# Patient Record
Sex: Female | Born: 1980 | Race: White | Hispanic: No | Marital: Single | Smoking: Current every day smoker
Health system: Southern US, Community
[De-identification: ages and names within clinical notes are randomized; demographics above are authoritative.]

## PROBLEM LIST (undated history)

## (undated) DIAGNOSIS — E282 Polycystic ovarian syndrome: Secondary | ICD-10-CM

## (undated) DIAGNOSIS — E079 Disorder of thyroid, unspecified: Secondary | ICD-10-CM

## (undated) HISTORY — PX: KNEE SURGERY: SHX244

---

## 2015-02-25 ENCOUNTER — Emergency Department (HOSPITAL_BASED_OUTPATIENT_CLINIC_OR_DEPARTMENT_OTHER)
Admission: EM | Admit: 2015-02-25 | Discharge: 2015-02-25 | Disposition: A | Discharge status: Home / Self Care | Payer: Medicaid Other | Attending: Emergency Medicine | Admitting: Emergency Medicine

## 2015-02-25 ENCOUNTER — Emergency Department (HOSPITAL_BASED_OUTPATIENT_CLINIC_OR_DEPARTMENT_OTHER): Payer: Medicaid Other

## 2015-02-25 ENCOUNTER — Encounter (HOSPITAL_BASED_OUTPATIENT_CLINIC_OR_DEPARTMENT_OTHER): Payer: Self-pay

## 2015-02-25 DIAGNOSIS — Z8543 Personal history of malignant neoplasm of ovary: Secondary | ICD-10-CM | POA: Insufficient documentation

## 2015-02-25 DIAGNOSIS — F17200 Nicotine dependence, unspecified, uncomplicated: Secondary | ICD-10-CM | POA: Insufficient documentation

## 2015-02-25 DIAGNOSIS — M79606 Pain in leg, unspecified: Secondary | ICD-10-CM

## 2015-02-25 DIAGNOSIS — R110 Nausea: Secondary | ICD-10-CM | POA: Insufficient documentation

## 2015-02-25 DIAGNOSIS — M7989 Other specified soft tissue disorders: Secondary | ICD-10-CM | POA: Insufficient documentation

## 2015-02-25 DIAGNOSIS — M79604 Pain in right leg: Secondary | ICD-10-CM | POA: Diagnosis not present

## 2015-02-25 DIAGNOSIS — M791 Myalgia: Secondary | ICD-10-CM | POA: Diagnosis not present

## 2015-02-25 DIAGNOSIS — M25561 Pain in right knee: Secondary | ICD-10-CM

## 2015-02-25 DIAGNOSIS — E079 Disorder of thyroid, unspecified: Secondary | ICD-10-CM | POA: Diagnosis not present

## 2015-02-25 DIAGNOSIS — M2550 Pain in unspecified joint: Secondary | ICD-10-CM | POA: Insufficient documentation

## 2015-02-25 HISTORY — DX: Polycystic ovarian syndrome: E28.2

## 2015-02-25 HISTORY — DX: Disorder of thyroid, unspecified: E07.9

## 2015-02-25 MED ORDER — ACETAMINOPHEN 500 MG PO TABS
1000.0000 mg | ORAL_TABLET | Freq: Once | ORAL | Status: AC
  Administered 2015-02-25: 1000 mg via ORAL
  Filled 2015-02-25: qty 2

## 2015-02-25 MED ORDER — HYDROCODONE-ACETAMINOPHEN 5-325 MG PO TABS: 1.0000 | ORAL_TABLET | Freq: Four times a day (QID) | ORAL | Status: AC | PRN

## 2015-02-25 NOTE — ED Notes (Signed)
Patient transported to Ultrasound 

## 2015-02-25 NOTE — Discharge Instructions (Signed)
1. Medications: alternate naprosyn and tylenol for pain control, usual home medications °2. Treatment: rest, ice, elevate and use brace, drink plenty of fluids, gentle stretching °3. Follow Up: Please followup with orthopedics as directed or your PCP in 1 week if no improvement for discussion of your diagnoses and further evaluation after today's visit; if you do not have a primary care doctor use the resource guide provided to find one; Please return to the ER for worsening symptoms or other concerns ° ° ° °Cryotherapy °Cryotherapy means treatment with cold. Ice or gel packs can be used to reduce both pain and swelling. Ice is the most helpful within the first 24 to 48 hours after an injury or flare-up from overusing a muscle or joint. Sprains, strains, spasms, burning pain, shooting pain, and aches can all be eased with ice. Ice can also be used when recovering from surgery. Ice is effective, has very few side effects, and is safe for most people to use. °PRECAUTIONS  °Ice is not a safe treatment option for people with: °· Raynaud phenomenon. This is a condition affecting small blood vessels in the extremities. Exposure to cold may cause your problems to return. °· Cold hypersensitivity. There are many forms of cold hypersensitivity, including: °¨ Cold urticaria. Red, itchy hives appear on the skin when the tissues begin to warm after being iced. °¨ Cold erythema. This is a red, itchy rash caused by exposure to cold. °¨ Cold hemoglobinuria. Red blood cells break down when the tissues begin to warm after being iced. The hemoglobin that carry oxygen are passed into the urine because they cannot combine with blood proteins fast enough. °· Numbness or altered sensitivity in the area being iced. °If you have any of the following conditions, do not use ice until you have discussed cryotherapy with your caregiver: °· Heart conditions, such as arrhythmia, angina, or chronic heart disease. °· High blood  pressure. °· Healing wounds or open skin in the area being iced. °· Current infections. °· Rheumatoid arthritis. °· Poor circulation. °· Diabetes. °Ice slows the blood flow in the region it is applied. This is beneficial when trying to stop inflamed tissues from spreading irritating chemicals to surrounding tissues. However, if you expose your skin to cold temperatures for too long or without the proper protection, you can damage your skin or nerves. Watch for signs of skin damage due to cold. °HOME CARE INSTRUCTIONS °Follow these tips to use ice and cold packs safely. °· Place a dry or damp towel between the ice and skin. A damp towel will cool the skin more quickly, so you may need to shorten the time that the ice is used. °· For a more rapid response, add gentle compression to the ice. °· Ice for no more than 10 to 20 minutes at a time. The bonier the area you are icing, the less time it will take to get the benefits of ice. °· Check your skin after 5 minutes to make sure there are no signs of a poor response to cold or skin damage. °· Rest 20 minutes or more between uses. °· Once your skin is numb, you can end your treatment. You can test numbness by very lightly touching your skin. The touch should be so light that you do not see the skin dimple from the pressure of your fingertip. When using ice, most people will feel these normal sensations in this order: cold, burning, aching, and numbness. °· Do not use ice on someone who   cannot communicate their responses to pain, such as small children or people with dementia. °HOW TO MAKE AN ICE PACK °Ice packs are the most common way to use ice therapy. Other methods include ice massage, ice baths, and cryosprays. Muscle creams that cause a cold, tingly feeling do not offer the same benefits that ice offers and should not be used as a substitute unless recommended by your caregiver. °To make an ice pack, do one of the following: °· Place crushed ice or a bag of frozen  vegetables in a sealable plastic bag. Squeeze out the excess air. Place this bag inside another plastic bag. Slide the bag into a pillowcase or place a damp towel between your skin and the bag. °· Mix 3 parts water with 1 part rubbing alcohol. Freeze the mixture in a sealable plastic bag. When you remove the mixture from the freezer, it will be slushy. Squeeze out the excess air. Place this bag inside another plastic bag. Slide the bag into a pillowcase or place a damp towel between your skin and the bag. °SEEK MEDICAL CARE IF: °· You develop white spots on your skin. This may give the skin a blotchy (mottled) appearance. °· Your skin turns blue or pale. °· Your skin becomes waxy or hard. °· Your swelling gets worse. °MAKE SURE YOU:  °· Understand these instructions. °· Will watch your condition. °· Will get help right away if you are not doing well or get worse. °  °This information is not intended to replace advice given to you by your health care provider. Make sure you discuss any questions you have with your health care provider. °  °Document Released: 10/30/2010 Document Revised: 03/26/2014 Document Reviewed: 10/30/2010 °Elsevier Interactive Patient Education ©2016 Elsevier Inc. ° ° ° °Emergency Department Resource Guide °1) Find a Doctor and Pay Out of Pocket °Although you won't have to find out who is covered by your insurance plan, it is a good idea to ask around and get recommendations. You will then need to call the office and see if the doctor you have chosen will accept you as a new patient and what types of options they offer for patients who are self-pay. Some doctors offer discounts or will set up payment plans for their patients who do not have insurance, but you will need to ask so you aren't surprised when you get to your appointment. ° °2) Contact Your Local Health Department °Not all health departments have doctors that can see patients for sick visits, but many do, so it is worth a call to see  if yours does. If you don't know where your local health department is, you can check in your phone book. The CDC also has a tool to help you locate your state's health department, and many state websites also have listings of all of their local health departments. ° °3) Find a Walk-in Clinic °If your illness is not likely to be very severe or complicated, you may want to try a walk in clinic. These are popping up all over the country in pharmacies, drugstores, and shopping centers. They're usually staffed by nurse practitioners or physician assistants that have been trained to treat common illnesses and complaints. They're usually fairly quick and inexpensive. However, if you have serious medical issues or chronic medical problems, these are probably not your best option. ° °No Primary Care Doctor: °- Call Health Connect at  [phone] - they can help you locate a primary care doctor that  accepts   your insurance, provides certain services, etc. - Physician Referral Service- [phone]  Chronic Pain Problems: Organization         Address  Phone   Notes  [facility]  [phone]) [phone] Patients need to be referred by their primary care doctor.   Medication Assistance: Organization         Address  Phone   Notes  [facility] Medication [facility] [address] [place], [place] [address] [phone] --Must be a resident of [facility] -- Must have NO insurance coverage whatsoever (no Medicaid/ Medicare, etc.) -- The pt. MUST have a primary care doctor that directs their care regularly and follows them in the community   MedAssist  [phone]) [phone]   [employer]  [phone]    Agencies that provide inexpensive medical care: Organization         Address  Phone   Notes  [facility]  [phone]   [facility]    [phone]   [facility] [address] [place], [place] [address] [phone])  [phone]   [facility] [place] [place] [address], [place] [phone]   Planned Parenthood    [phone]) [phone]   [facility]    [phone]   Community Health and [facility]  [address], [place] Phone:  [phone], Fax:  [phone] Hours of Operation:  9 am - 6 pm, M-F.  Also accepts Medicaid/Medicare and self-pay.  [facility]  [address], [place] Phone: [phone], Fax: [phone]. Hours of Operation:  8:30 am - 5:30 pm, M-F.  Also accepts Medicaid and self-pay.  [facility] [place] [address], [place] Phone: [phone]) [phone]   [facility] [address], [place], [place] [phone], Ext. [phone] Mondays & Thursdays: 7-9 AM.  First 15 patients are seen on a first come, first serve basis.    Medicaid-accepting [facility] Providers:  Organization         Address  Phone   Notes  [facility] [address], [place] [phone] Also accepts self-pay patients.  [facility] [address] [place], [place]  [phone]   [facility] [address], [place] [phone]) [phone]   [facility] [address] [place] [address], [place] [phone]   Renaye RakersVeita Bland [address], [place]   [phone] Only accepts [place] Access [place] patients after they have their name applied to their card.   Self-Pay (no insurance) in [facility] [place]:  Organization         Address  Phone   Notes  Sickle Cell Patients, [facility] [address], [place] [phone]) [phone]   [facility] [address], [place] [phone]   [facility] [place]  [address], [place] [phone]) [phone]   [facility]/Dr. Osei-Bonsu  [address] [place] [address], [place] or [phone], [place] [phone]) [phone] Phone number for both [place] and [place] locations is the same.  [facility] [address], [place] [phone]) [phone]   [facility] [place] [address] [place] [address], [place] or [address] [phone]) [phone]) [phone]   [facility]  [address], [place] [phone], phone; [phone], fax Sees patients 1st and 3rd Saturday of every month.  Must not qualify for public or private insurance (i.e. Medicaid, Medicare, [place] Health Choice, Veterans' Benefits)  Household income should be no more than 200% of the poverty level The clinic cannot treat you if you are pregnant or think you are pregnant  Sexually transmitted diseases are not treated at the clinic.    Dental Care: Organization         Address  Phone  Notes  [facility] Department of [facility] [address], [place] [phone] Accepts children up to age 36 who are enrolled in [place]icaid or [place] Health Choice; pregnant women with a Medicaid card; and children who have applied for Medicaid or [place] Health Choice, but were declined, whose parents can pay a reduced fee at time of service.  [facility] Department of [facility] [place]  [address], [place] [phone] Accepts children up to age 36 who are enrolled in [place]icaid or [place] Health Choice; pregnant women with a Medicaid card; and children who have applied for Medicaid or [place] Health Choice, but were declined, whose parents can pay a reduced fee at time of service.  [facility]  [address], [place] [phone] Patients are seen by appointment only. Walk-ins are not accepted. [facility] will see patients 36 years of age and older. Monday - Tuesday (8am-5pm) Most Wednesdays (8:30-5pm) $30 per visit, cash only  [facility]  [address], [facility] [place] [phone] Patients are  seen by appointment only. Walk-ins are not accepted. [facility] will see patients 36 years of age and older. One Wednesday Evening (Monthly: Volunteer Based).  $30 per visit, cash only  [employer] [facility]  [phone] for adults; Children under age 624, call Graduate Pediatric Dentistry at [phone]. Children aged 374-14, please call [phone] to request a pediatric application.  Dental services are provided in all areas of dental care including fillings, crowns and bridges, complete and partial dentures, implants, gum treatment, root canals, and extractions. Preventive care is also provided. Treatment is provided to both adults and children. Patients are selected via a lottery and there is often a waiting list.   [facility] [address], [place]  [phone]) [phone] [email or web address]   [facility] [address], [place], [place] [phone], Ext. [phone] Second and Fourth Thursday of each month, opens at 6:30 AM; Clinic ends at 9 AM.  Patients are seen on a first-come first-served basis, and a limited number are seen during each clinic.   [facility]  [address], [place], [place] [phone]) [phone]   Eligibility Requirements You must have lived in [place], [place], or [place] counties for at least the last three months.   You cannot be eligible for state or federal sponsored National Cityhealthcare insurance, including CIGNAVeterans Administration, [place]icaid, or [employer] EntertainmentMedicare.   You generally cannot be eligible for healthcare insurance through your employer.    How to apply: Eligibility screenings are held every Tuesday and Wednesday afternoon from 1:00 pm until 4:00 pm. You do not need an appointment for the interview!  [facility] [address], [place], [place] [phone]   [facility]  [phone]   [facility]  [phone]   [facility]  [phone]  Behavioral Health Resources in the Community: Intensive Outpatient Programs Organization         Address  Phone  Notes  [place]. [address] [place], [place] [phone]   [facility] [address] [place], [place]   ADS: Alcohol & Drug Svcs [address], [place], [place]   [place] [address]  [place], [place] or [phone]   Substance Abuse Resources Organization         Address  Phone  Notes  Alcohol and Drug Services  [phone]   [place]  [phone]   The [place]   Chinita Pester  [phone]   Residential & Outpatient Substance Abuse Program  [phone]   Psychological Services Organization         Address  Phone  Notes  [facility] [place]  [place]  [phone]   Normal [address], [place] or [phone]    Mobile Crisis Teams Organization         Address  Phone  Notes  [facility], Mobile Crisis Care Unit  [phone]   [facility]otherapeutic [facility]  [address] [place], [place]   Bas[place] Levels [address], [place] [phone]    Self-Help/Support Groups Organization         Address  Phone             Notes  [place]. of [place] - variety of support groups  [facility] Call for more information  Narcotics Anonymous (NA), [facility]aring [facility] [address], [employer] [place]  2 meetings at this location   Special educational needs teacher         Address  Phone  Notes  [facility] [place],    [place]  [phone]   [facility]  [address], [place] [address]16384, [place], [place]   [place], [place] [phone] Admissions: 8am-3pm M-F  Incentives  Substance [place] [address]    [place], [place] [phone]   [facility] [address] [place], [place], [place]   [facility] [address],  [place], [place]   [facility]ight Programs - Intensive Outpatient [address], [place], [place]   [facility] [address]  [place], [place] [phone] or [phone]   [facility] (RTS) [address] [place], [place] Accepts Medicaid  [facility] [place] [address]  [place] [phone] Substance Abuse/Addiction Treatment   [facility] Organization         Address  Phone  Notes  [facility]  [phone]   Domenic Schwab, PhD [address] Arlis Porta [place], [place]   [phone] or [phone]   [place] [address] [place], [place] [phone]   [place] [address], [place], [place] [phone] Insurance/Medicaid/sponsorship through [employer] and Families [address], [place]                                    [place], [place] [phone] [place],  [place] [phone]    °Dr. Arfeen  [phone]   °[facility] [place]  [employer] [place] [facility]. 1) [address], [place] °2) [address], [place] °3)  [address], [place] [phone] °[phone] ° °([phone]   °[place] Child Abuse Hotline [phone]) [phone] or [phone] (After Hours)    ° ° ° °

## 2015-02-25 NOTE — ED Provider Notes (Signed)
CSN: [id number]     Arrival date & time 02/25/15  1237 History   First MD Initiated Contact with Patient 02/25/15 1251     Chief Complaint  Patient presents with  . Leg Pain     (Consider location/radiation/quality/duration/timing/severity/associated sxs/prior Treatment) The history is provided by the patient and medical records. No language interpreter was used.     Hannah Rollins is a 34 y.o. female  with a hx of PCOS, Thyroid disease presents to the Emergency Department complaining of gradual, persistent, progressively worsening nontraumatic anterior right lower leg pain onset 3 days ago. Associated symptoms include swelling at the site, intermittent paresthesias of the right toes, radiation of the pain into the right knee and mild nausea without vomiting.  Pt reports she is able to walk however, it is painful.  Pt was seen at a local urgent care yesterday and diagnosed with thrombophlebitis after normal plain films.  Pt has no hx of DVT, periods of immobilization, estrogen usage, recent surgeries or fractures.  Pt reports she is a smoker.  She reports a family Hx of CAD, but no personal hx.  She has been taking mobic without relief, last dose was 8pm last night.  nothingmakes it better and walking makes it worse.  Pt denies fever, chills, headache, neck pain, chest pain, SOB, palpitations, hemoptysis, abd pain, vomiting, diarrhea, weakness, dizziness, syncope.     Past Medical History  Diagnosis Date  . PCOS (polycystic ovarian syndrome)   . Thyroid disease    Past Surgical History  Procedure Laterality Date  . Knee surgery     No family history on file. Social History  Substance Use Topics  . Smoking status: Current Every Day Smoker  . Smokeless tobacco: None  . Alcohol Use: Yes     Comment: occ   OB History    No data available     Review of Systems  Constitutional: Negative for fever, chills, diaphoresis, appetite change, fatigue and unexpected weight change.  HENT:  Negative for mouth sores.   Eyes: Negative for visual disturbance.  Respiratory: Negative for cough, chest tightness, shortness of breath and wheezing.   Cardiovascular: Negative for chest pain.  Gastrointestinal: Negative for nausea, vomiting, abdominal pain, diarrhea and constipation.  Endocrine: Negative for polydipsia, polyphagia and polyuria.  Genitourinary: Negative for dysuria, urgency, frequency and hematuria.  Musculoskeletal: Positive for myalgias ( right lower anterior leg) and arthralgias ( right lower anterior leg). Negative for back pain, joint swelling, neck pain and neck stiffness.  Skin: Negative for rash and wound.  Allergic/Immunologic: Negative for immunocompromised state.  Neurological: Negative for syncope, light-headedness, numbness and headaches.  Hematological: Does not bruise/bleed easily.  Psychiatric/Behavioral: Negative for sleep disturbance. The patient is not nervous/anxious.   All other systems reviewed and are negative.     Allergies  Codeine; Dilaudid; and Ivp dye  Home Medications   Prior to Admission medications   Medication Sig Start Date End Date Taking? Authorizing Provider  Meloxicam (MOBIC PO) Take by mouth.   Yes Historical Provider, MD  METHIMAZOLE PO Take by mouth.   Yes Historical Provider, MD  HYDROcodone-acetaminophen (NORCO/VICODIN) 5-325 MG tablet Take 1 tablet by mouth every 6 (six) hours as needed for moderate pain or severe pain. 02/25/15   Jaskarn Schweer, PA-C   BP 133/85 mmHg  Pulse 82  Temp(Src) 97.9 F (36.6 C) (Oral)  Resp 16  Ht  (1.6 m)  Wt 72.122 kg  BMI 28.17 kg/m2  SpO2 100% Physical Exam  Constitutional: She appears well-developed and well-nourished. No distress.  Awake, alert, nontoxic appearance  HENT:  Head: Normocephalic and atraumatic.  Mouth/Throat: Oropharynx is clear and moist. No oropharyngeal exudate.  Eyes: Conjunctivae are normal. No scleral icterus.  Neck: Normal range of motion. Neck  supple.  Cardiovascular: Normal rate, regular rhythm, normal heart sounds and intact distal pulses.   Pulses:      Radial pulses are 2+ on the right side, and 2+ on the left side.       Dorsalis pedis pulses are 2+ on the right side, and 2+ on the left side.       Posterior tibial pulses are 2+ on the right side, and 2+ on the left side.  Capillary refill brisk and < 3 sec in all extremities Bilateral feet are equally cool to the touch  Pulmonary/Chest: Effort normal and breath sounds normal. No respiratory distress. She has no wheezes.  Equal chest expansion  Abdominal: Soft. Bowel sounds are normal. She exhibits no mass. There is no tenderness. There is no rebound and no guarding.  Musculoskeletal: Normal range of motion. She exhibits tenderness. She exhibits no edema.  ROM: FROM of the right hip, knee, ankle and all toes Swelling and tenderness noted to the right lower leg, midshaft of the tibia and progressing laterally without erythema, ecchymosis, or increased warmth No swelling of the ankle Mild TTP of the right distal posterior calf without palpable cord Compartments are soft  Neurological: She is alert. Coordination normal.  Sensation intact to dull and sharp Strength 5/5 in the RLE  Skin: Skin is warm and dry. She is not diaphoretic. No erythema.  No tenting of the skin  Psychiatric: She has a normal mood and affect.  Nursing note and vitals reviewed.   ED Course  Procedures (including critical care time)  Imaging Review US Venous Img Lower Unilateral Right  02/25/2015  CLINICAL DATA:  Lower leg swelling and pain x2 days, previous knee arthroscopy EXAM: RIGHT LOWER EXTREMITY VENOUS DOPPLER ULTRASOUND TECHNIQUE: Gray-scale sonography with compression, as well as color and duplex ultrasound, were performed to evaluate the deep venous system from the level of the common femoral vein through the popliteal and proximal calf veins. COMPARISON:  None FINDINGS: Normal compressibility  of the common femoral, superficial femoral, and popliteal veins, as well as the proximal calf veins. No filling defects to suggest DVT on grayscale or color Doppler imaging. Doppler waveforms show normal direction of venous flow, normal respiratory phasicity and response to augmentation. Visualized segments of the saphenous venous system normal in caliber and compressibility. Survey views of the contralateral common femoral vein are unremarkable. IMPRESSION: 1. No evidence of lower extremity deep vein thrombosis, RIGHT. Electronically Signed   By: Corlis Leak M.D.   On: 02/25/2015 14:44   I have personally reviewed and evaluated these images and lab results as part of my medical decision-making.  MDM   Final diagnoses:  Leg pain  Leg swelling  Arthralgia of right lower leg   Hannah Rollins presents with right lower leg pain and swelling.  Pain and swelling not classically consistent with DVT, however will obtain venous duplex as plain films were reportedly negative yesterday.    2:56 PM Patient continues to decline pain control. Venous duplex is without evidence of DVT. She remains tender but her compartments remain soft. No erythema or induration to suggest cellulitis.  Treatment and sensation are intact in the right lower extremity. She is without back pain. No weakness or  numbness to suggest radiculopathy or nerve entrapment.  We'll discharge home with close follow-up with her primary care physician. Strict return precautions given for return to the emergency department including chest pain, shortness of breath, fevers, inability to walk, weakness, numbness redness or worsening swelling of the leg.  The patient was discussed with and seen by Dr. Madilyn Hookees who agrees with the treatment plan.  BP 133/85 mmHg  Pulse 82  Temp(Src) 97.9 F (36.6 C) (Oral)  Resp 16  Ht 5\' 3"  (1.6 m)  Wt 72.122 kg  BMI 28.17 kg/m2  SpO2 100%   Dierdre ForthHannah Sourish Allender, PA-C 02/25/15 1858  Tilden FossaElizabeth Rees,  MD 02/26/15 [phone]

## 2015-02-25 NOTE — ED Notes (Signed)
C/o right lower leg pain x 3 days-denies injury-was seen at urgent care yesterday with tib/fib xray negative-dx "thrombophlebitis"-rx mobic-pain and swelling are worse-NAD-limping gait

## 2017-03-25 IMAGING — US US EXTREM LOW VENOUS*R*
1 series · 14 of 24 positions shown · non-contrast
Comparison: None

CLINICAL DATA: Lower leg swelling and pain x2 days, previous knee
arthroscopy

EXAM:
RIGHT LOWER EXTREMITY VENOUS DOPPLER ULTRASOUND
TECHNIQUE: Gray-scale sonography with compression, as well as color and duplex
ultrasound, were performed to evaluate the deep venous system from
the level of the common femoral vein through the popliteal and
proximal calf veins.

[Series 1: us extrem low venous*right* · 0.06mm/px · 14 of 30 slices shown]
[im 1/30]
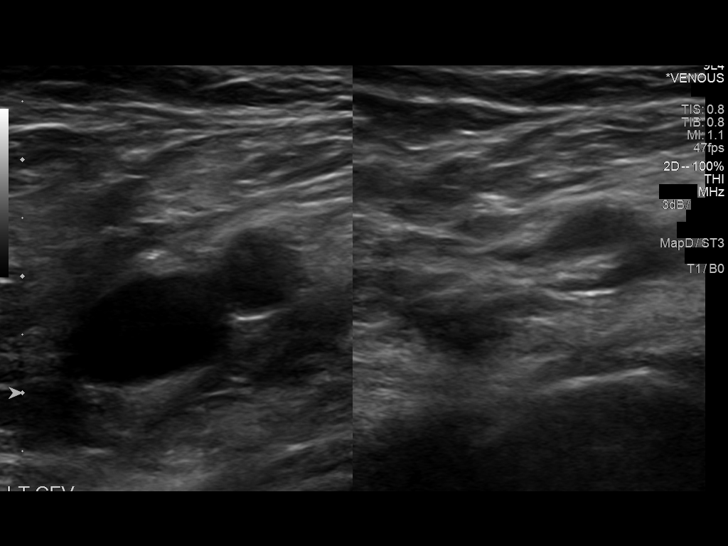
[im 3/30]
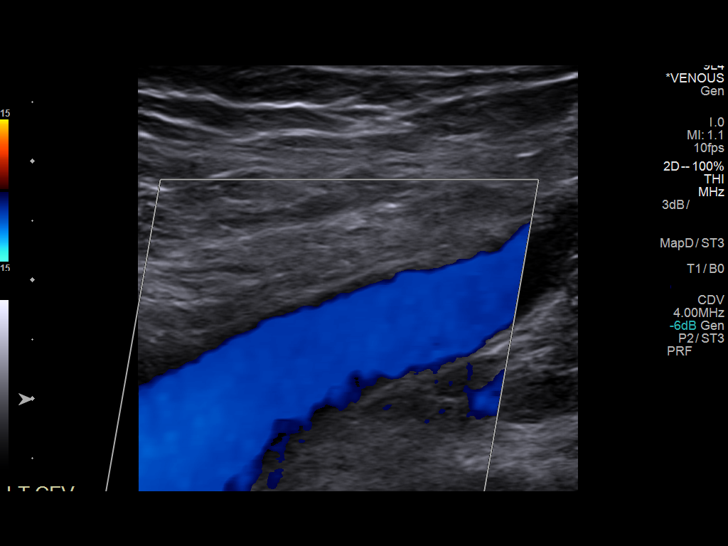
[im 6/30]
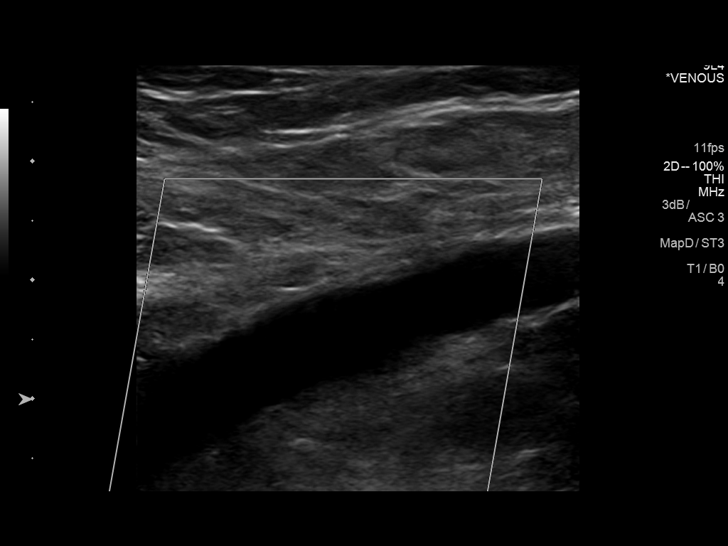
[im 8/30]
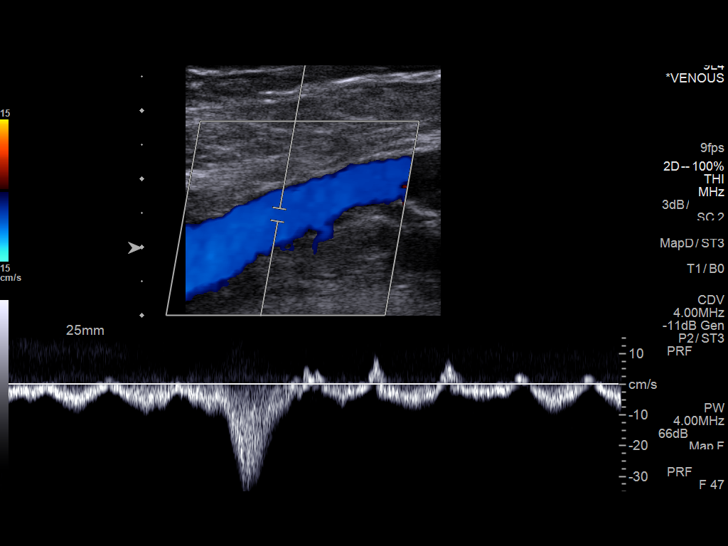
[im 9/30]
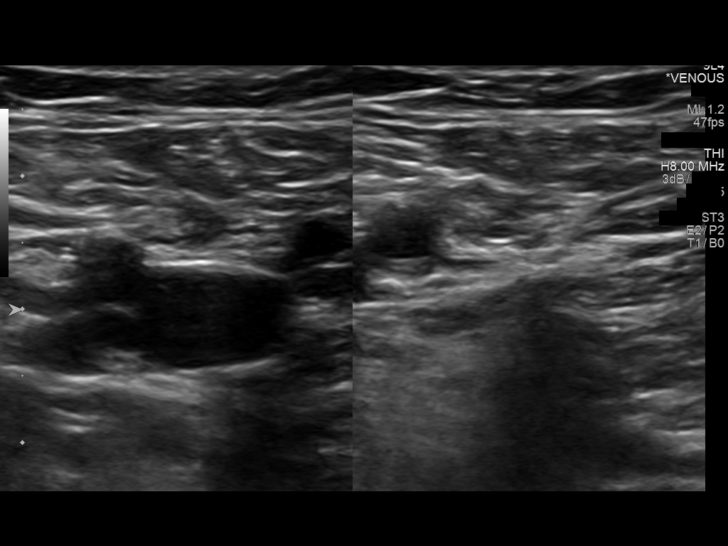
[im 12/30]
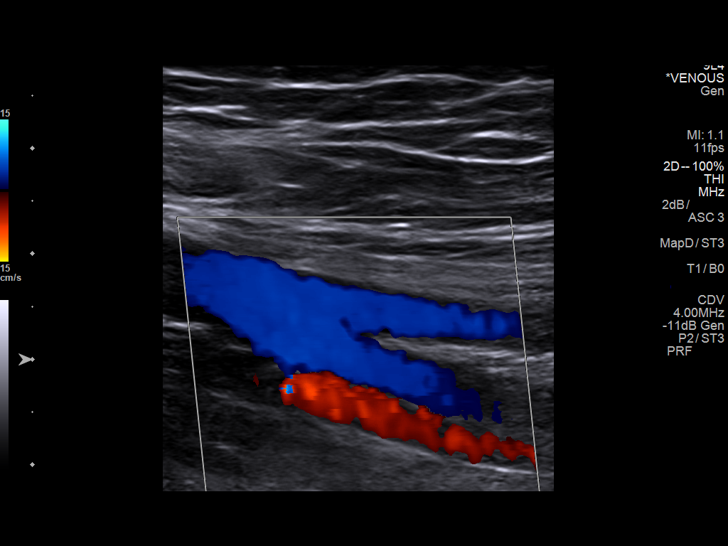
[im 14/30]
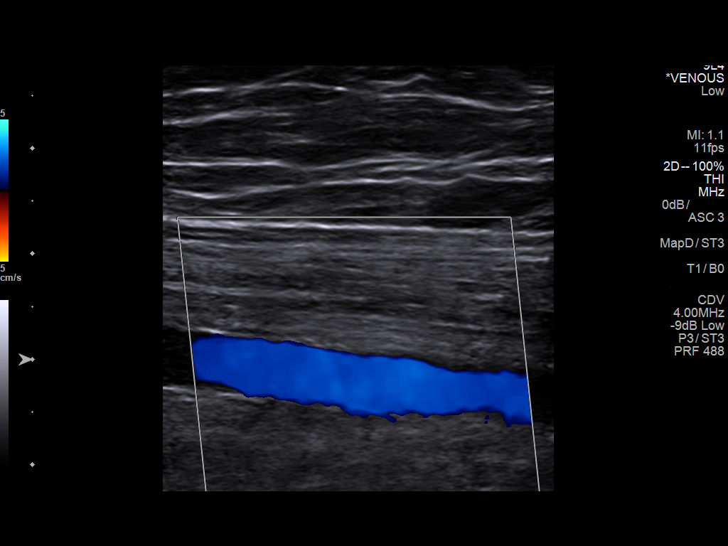
[im 16/30]
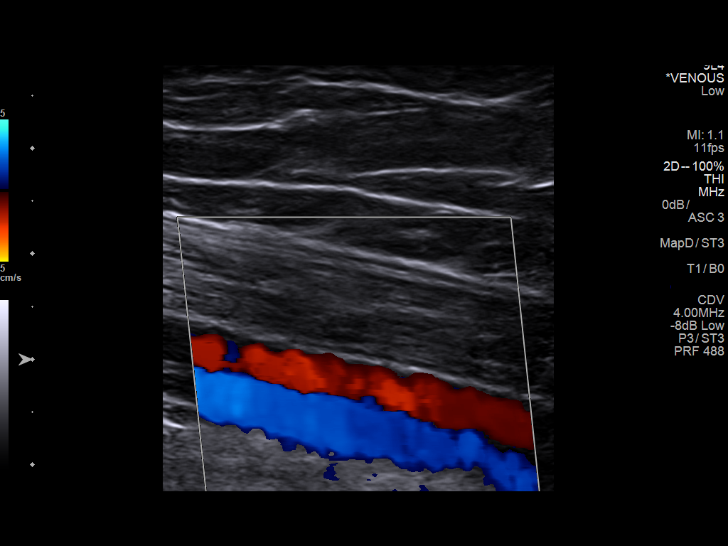
[im 18/30]
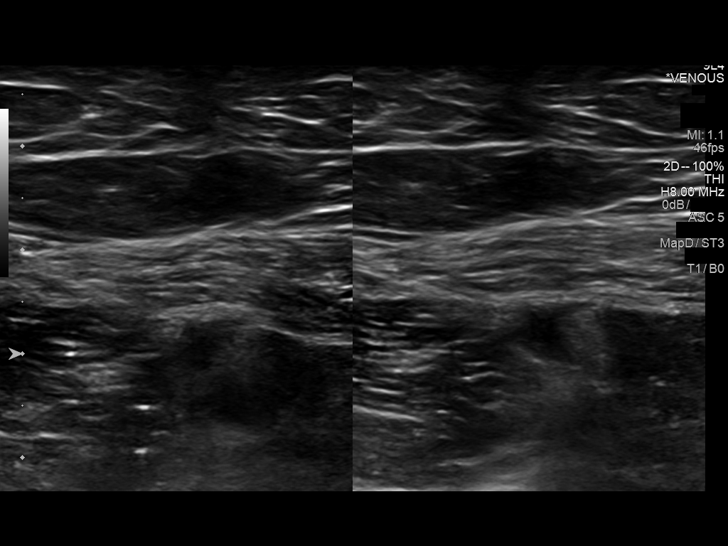
[im 21/30]
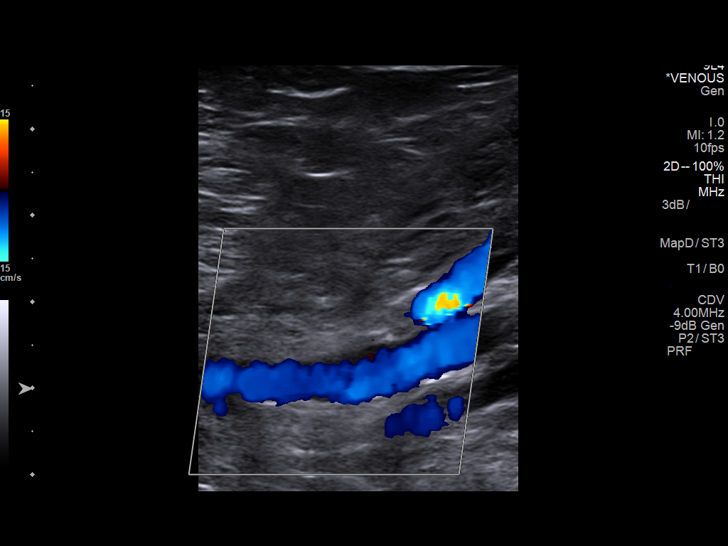
[im 23/30]
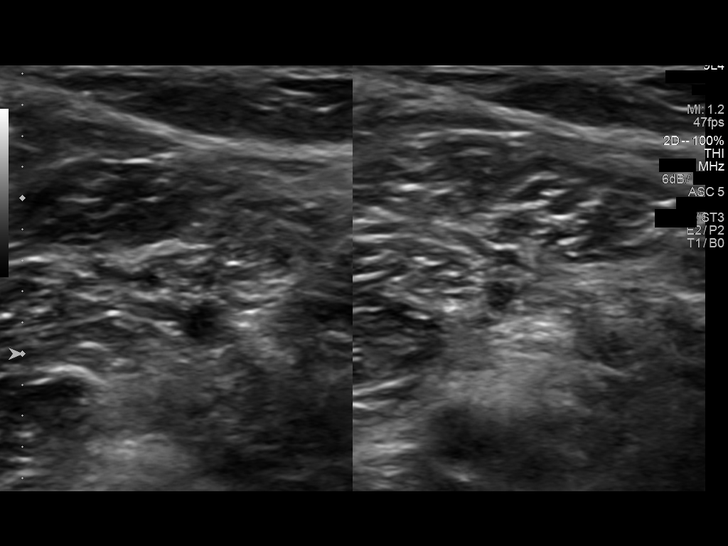
[im 24/30]
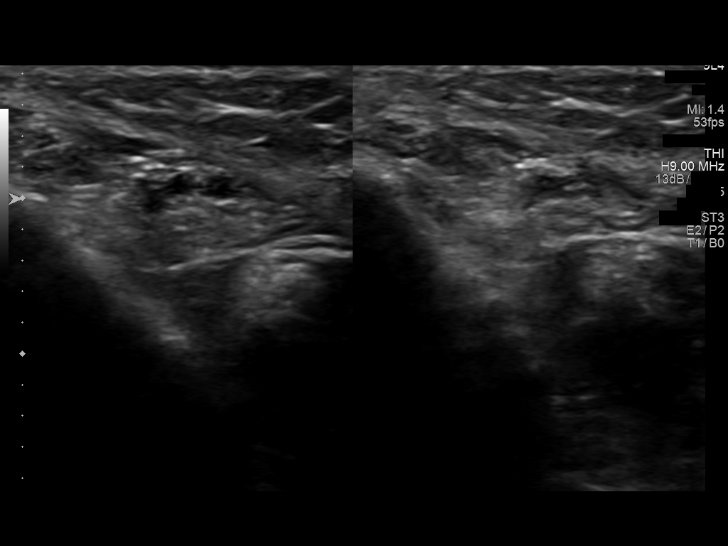
[im 27/30]
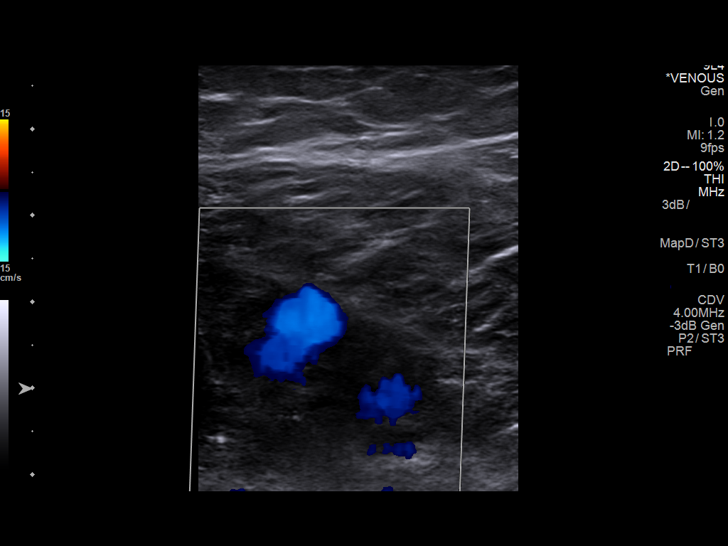
[im 30/30]
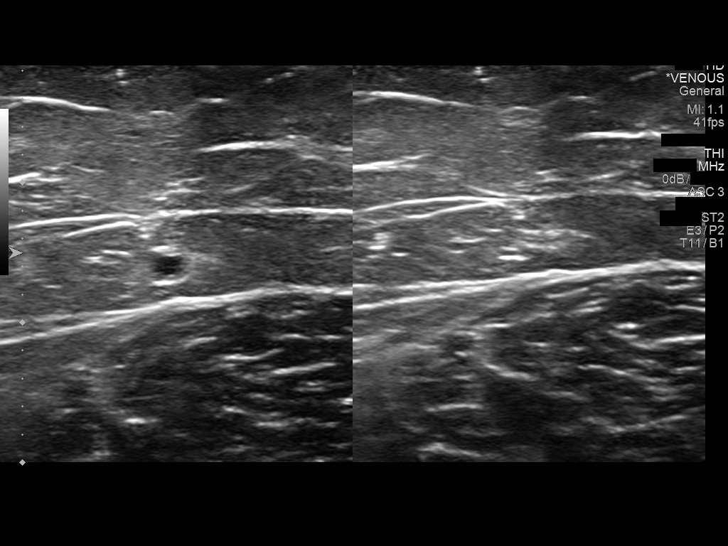

[14 of 24 positions shown; findings below may reference images not displayed]

FINDINGS: Normal compressibility of the common femoral, superficial femoral,
and popliteal veins, as well as the proximal calf veins. No filling
defects to suggest DVT on grayscale or color Doppler imaging.
Doppler waveforms show normal direction of venous flow, normal
respiratory phasicity and response to augmentation. Visualized
segments of the saphenous venous system normal in caliber and
compressibility. Survey views of the contralateral common femoral
vein are unremarkable.
IMPRESSION: 1. No evidence of lower extremity deep vein thrombosis, RIGHT.
